# Patient Record
Sex: Female | Born: 1965
Health system: Southern US, Community
[De-identification: ages and names within clinical notes are randomized; demographics above are authoritative.]

## PROBLEM LIST (undated history)

## (undated) DIAGNOSIS — M311 Thrombotic microangiopathy: Secondary | ICD-10-CM

## (undated) DIAGNOSIS — M3119 Other thrombotic microangiopathy: Secondary | ICD-10-CM

## (undated) DIAGNOSIS — M549 Dorsalgia, unspecified: Secondary | ICD-10-CM

## (undated) DIAGNOSIS — M109 Gout, unspecified: Secondary | ICD-10-CM

## (undated) DIAGNOSIS — I10 Essential (primary) hypertension: Secondary | ICD-10-CM

## (undated) HISTORY — PX: ABDOMINAL HYSTERECTOMY: SHX81

## (undated) HISTORY — PX: HEMORROIDECTOMY: SUR656

---

## 2007-11-01 ENCOUNTER — Emergency Department: Payer: Self-pay | Admitting: Internal Medicine

## 2008-12-07 ENCOUNTER — Ambulatory Visit (HOSPITAL_COMMUNITY): Admission: RE | Admit: 2008-12-07 | Discharge: 2008-12-07 | Payer: Self-pay | Admitting: Gastroenterology

## 2008-12-22 ENCOUNTER — Ambulatory Visit (HOSPITAL_COMMUNITY): Admission: RE | Admit: 2008-12-22 | Discharge: 2008-12-22 | Payer: Self-pay | Admitting: Gastroenterology

## 2009-10-05 ENCOUNTER — Ambulatory Visit: Payer: Self-pay | Admitting: Endocrinology

## 2010-09-16 IMAGING — RF DG ESOPHAGUS
19 of 24 series · 19 of 24 positions shown · non-contrast
Comparison: None

CLINICAL DATA: Dysphagia with liquids, solids.  Mild epigastric
pain after eating.  History prior esophageal dilatation.

DOUBLE CONTRAST ESOPHAGRAM
TECHNIQUE: Standard double contrast esophagram was performed.
Fluoroscopy Time: 3.7 minutes

[Series 1: run · 1 of 9 slices shown (1 of 19)]
[im 1/9]
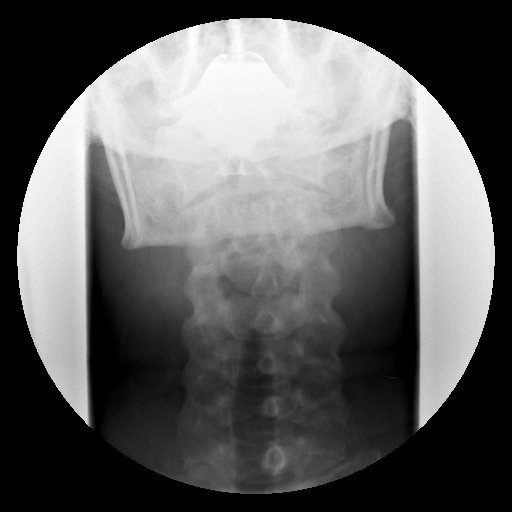

[Series 2: run · 1 of 6 slices shown (2 of 19)]
[im 1/6]
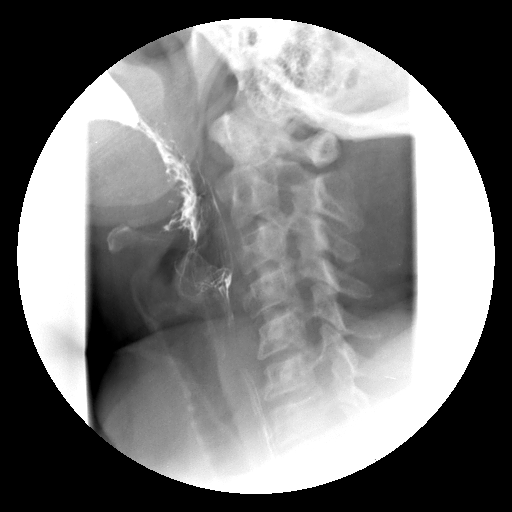

[Series 4: run · 1 of 3 slices shown (3 of 19)]
[im 1/3]
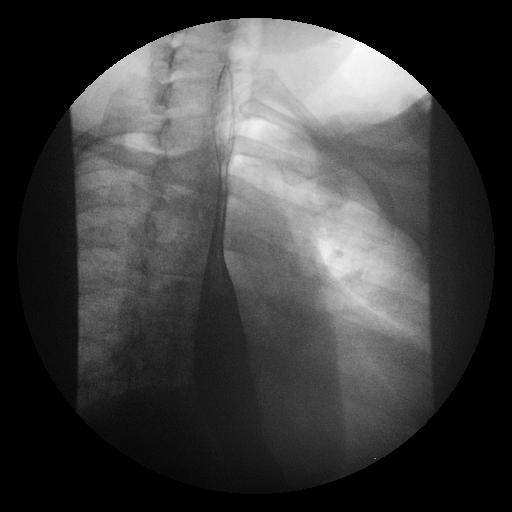

[Series 5: run · 1 of 3 slices shown (4 of 19)]
[im 1/3]
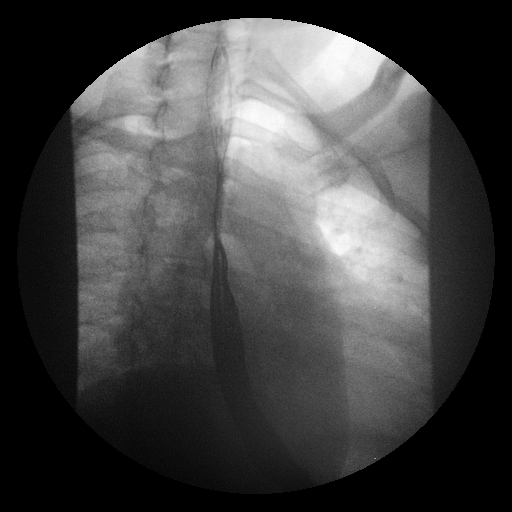

[Series 6: run · 1 of 4 slices shown (5 of 19)]
[im 1/4]
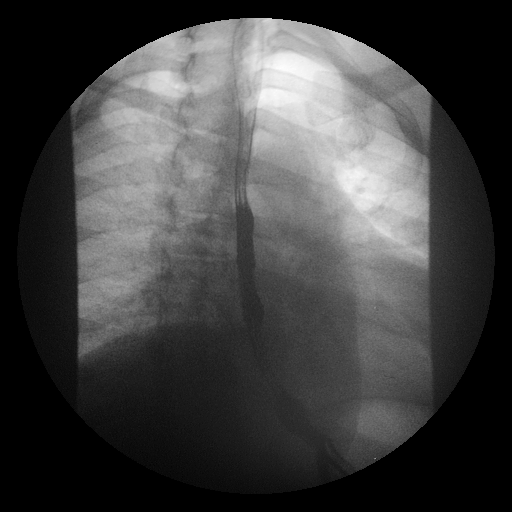

[Series 7: run · 1 of 3 slices shown (6 of 19)]
[im 1/3]
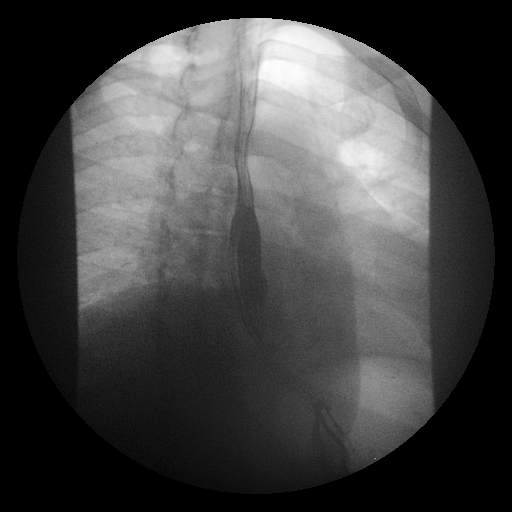

[Series 9: run · 1 of 4 slices shown (7 of 19)]
[im 1/4]
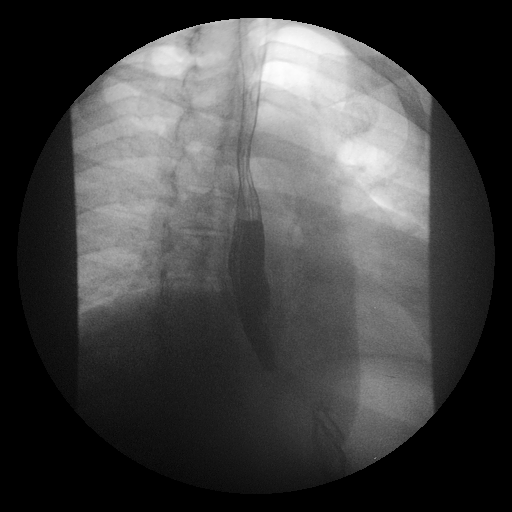

[Series 10: run · 1 of 1 slices shown (8 of 19)]
[im 1/1]
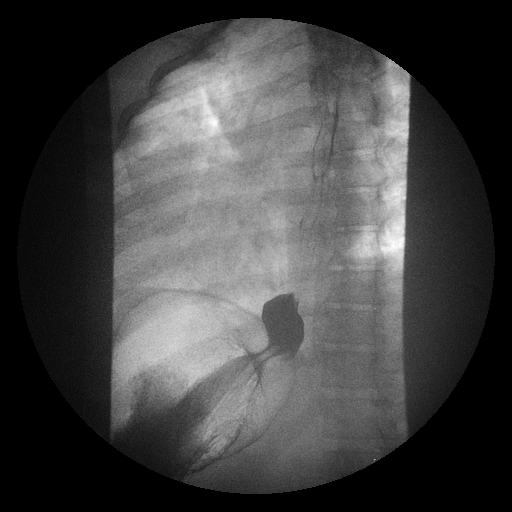

[Series 11: run · 1 of 1 slices shown (9 of 19)]
[im 1/1]
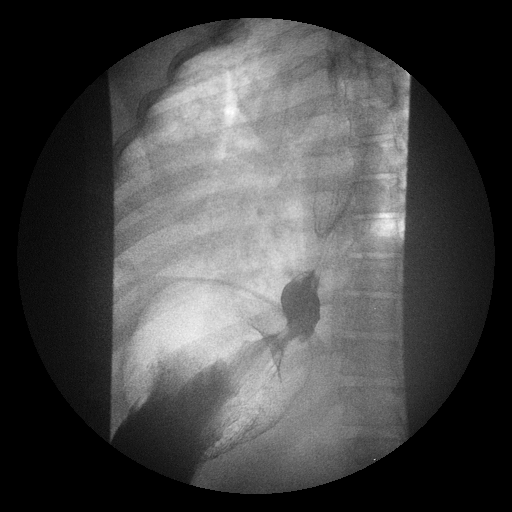

[Series 13: run · 1 of 3 slices shown (10 of 19)]
[im 1/3]
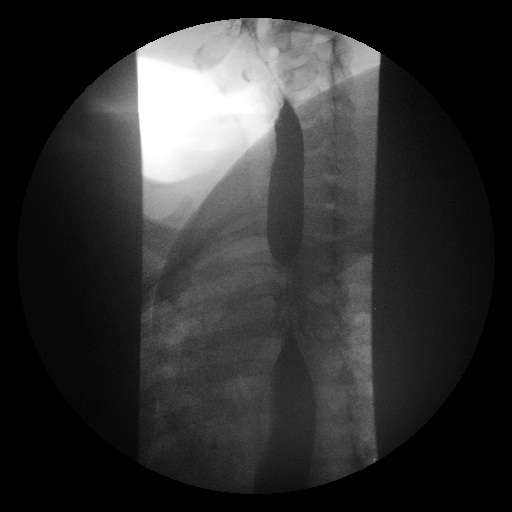

[Series 14: run · 1 of 1 slices shown (11 of 19)]
[im 1/1]
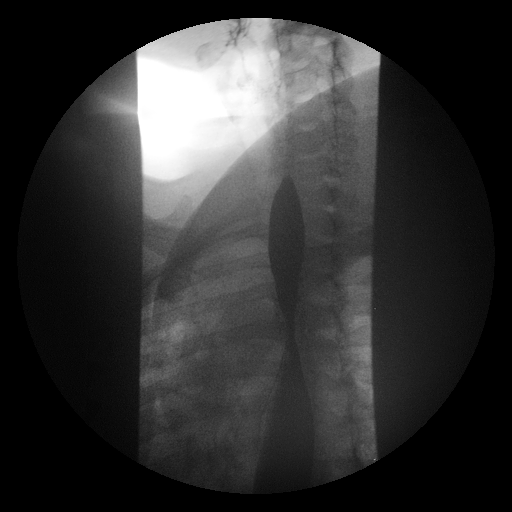

[Series 15: run · 1 of 3 slices shown (12 of 19)]
[im 1/3]
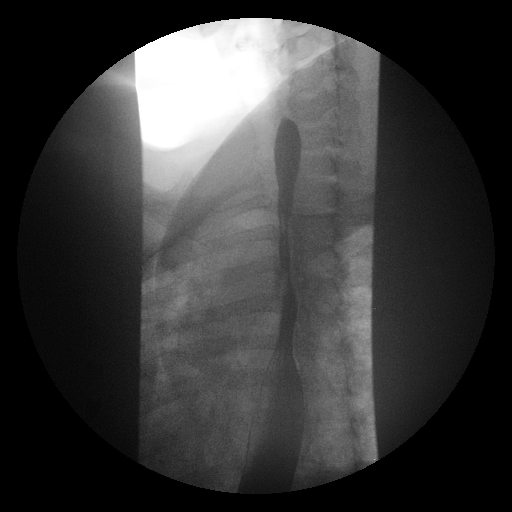

[Series 16: run · 1 of 1 slices shown (13 of 19)]
[im 1/1]
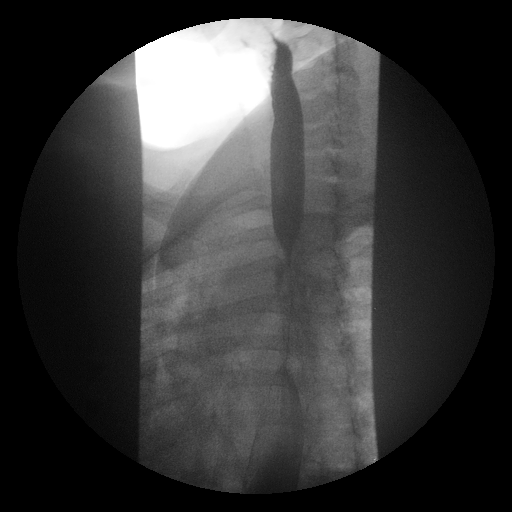

[Series 18: run · 1 of 1 slices shown (14 of 19)]
[im 1/1]
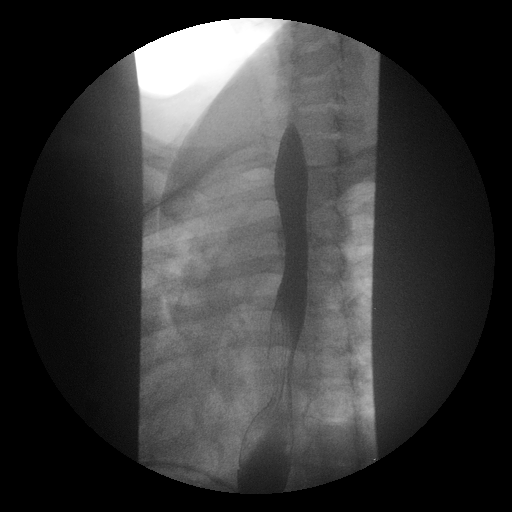

[Series 19: run · 1 of 1 slices shown (15 of 19)]
[im 1/1]
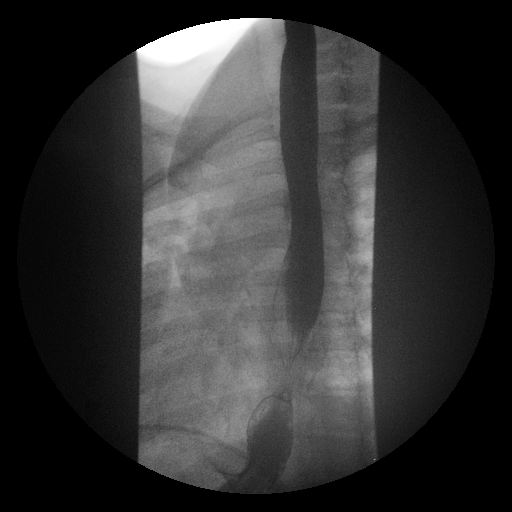

[Series 20: run · 1 of 1 slices shown (16 of 19)]
[im 1/1]
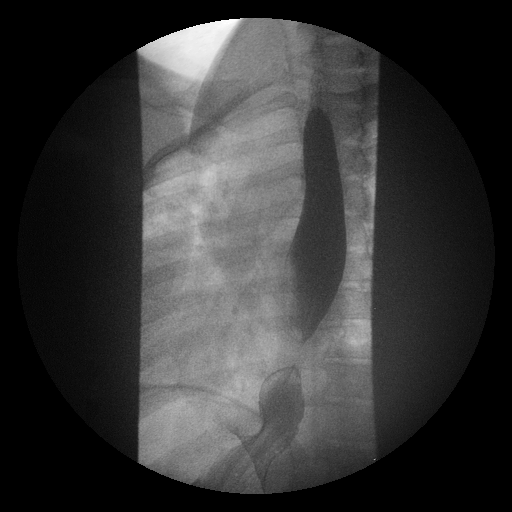

[Series 21: run · 1 of 1 slices shown (17 of 19)]
[im 1/1]
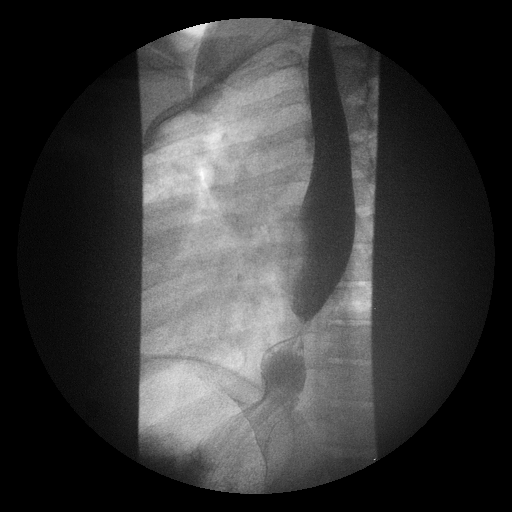

[Series 23: run · 1 of 1 slices shown (18 of 19)]
[im 1/1]
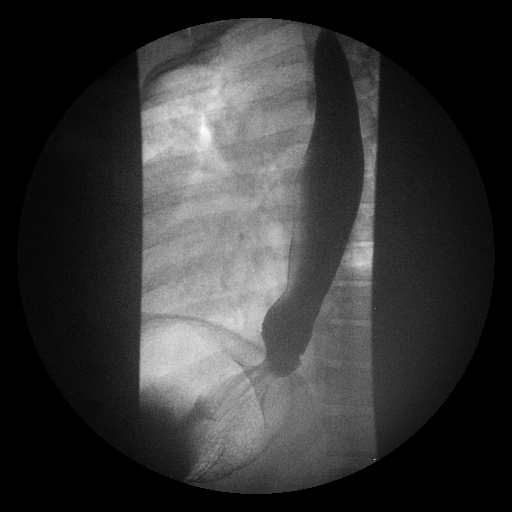

[Series 24: run · 1 of 1 slices shown (19 of 19)]
[im 1/1]
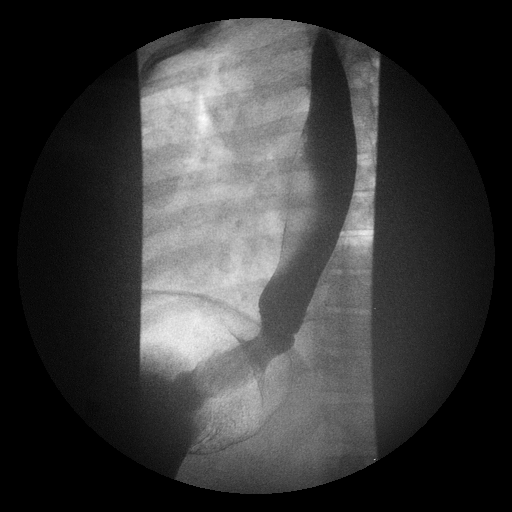

[19 of 24 positions shown; findings below may reference images not displayed]

FINDINGS: Hypopharyngeal portion of the exam demonstrates no
significant findings.

Double contrast evaluation of the esophagus demonstrates no mucosal
abnormality.

Evaluation of primary peristalsis demonstrates a normal primary
peristaltic wave.

Full column evaluation of the esophagus demonstrates small hiatal
hernia.  A wide-mouth, likely not clinically significant mucosal
ring is identified on series 27.

13 mm barium tablet passes without difficulty.
IMPRESSION: 1.  Small hiatal hernia.
2.  Wide-mouth, likely not clinically significant mucosal ring.

## 2016-08-28 ENCOUNTER — Encounter (HOSPITAL_BASED_OUTPATIENT_CLINIC_OR_DEPARTMENT_OTHER): Payer: Self-pay

## 2016-08-28 ENCOUNTER — Emergency Department (HOSPITAL_BASED_OUTPATIENT_CLINIC_OR_DEPARTMENT_OTHER)
Admission: EM | Admit: 2016-08-28 | Discharge: 2016-08-28 | Disposition: A | Discharge status: Home / Self Care | Payer: Managed Care, Other (non HMO) | Attending: Emergency Medicine | Admitting: Emergency Medicine

## 2016-08-28 DIAGNOSIS — I10 Essential (primary) hypertension: Secondary | ICD-10-CM | POA: Diagnosis not present

## 2016-08-28 DIAGNOSIS — M545 Low back pain, unspecified: Secondary | ICD-10-CM

## 2016-08-28 DIAGNOSIS — Z79899 Other long term (current) drug therapy: Secondary | ICD-10-CM | POA: Diagnosis not present

## 2016-08-28 DIAGNOSIS — F17210 Nicotine dependence, cigarettes, uncomplicated: Secondary | ICD-10-CM | POA: Diagnosis not present

## 2016-08-28 HISTORY — DX: Other thrombotic microangiopathy: M31.19

## 2016-08-28 HISTORY — DX: Thrombotic microangiopathy: M31.1

## 2016-08-28 HISTORY — DX: Gout, unspecified: M10.9

## 2016-08-28 HISTORY — DX: Dorsalgia, unspecified: M54.9

## 2016-08-28 HISTORY — DX: Essential (primary) hypertension: I10

## 2016-08-28 LAB — URINALYSIS, ROUTINE W REFLEX MICROSCOPIC
BILIRUBIN URINE: NEGATIVE
Glucose, UA: NEGATIVE mg/dL
Hgb urine dipstick: NEGATIVE
KETONES UR: NEGATIVE mg/dL
LEUKOCYTES UA: NEGATIVE
NITRITE: NEGATIVE
PH: 6.5 (ref 5.0–8.0)
PROTEIN: NEGATIVE mg/dL
Specific Gravity, Urine: 1.019 (ref 1.005–1.030)

## 2016-08-28 MED ORDER — HYDROMORPHONE HCL 1 MG/ML IJ SOLN
1.0000 mg | Freq: Once | INTRAMUSCULAR | Status: AC
  Administered 2016-08-28: 1 mg via INTRAMUSCULAR
  Filled 2016-08-28: qty 1

## 2016-08-28 MED ORDER — CYCLOBENZAPRINE HCL 10 MG PO TABS: 10.0000 mg | ORAL_TABLET | Freq: Two times a day (BID) | ORAL | 0 refills | Status: AC | PRN

## 2016-08-28 MED ORDER — DEXAMETHASONE SODIUM PHOSPHATE 10 MG/ML IJ SOLN
10.0000 mg | Freq: Once | INTRAMUSCULAR | Status: AC
  Administered 2016-08-28: 10 mg via INTRAMUSCULAR
  Filled 2016-08-28: qty 1

## 2016-08-28 MED ORDER — TRAMADOL HCL 50 MG PO TABS: 50.0000 mg | ORAL_TABLET | Freq: Four times a day (QID) | ORAL | 0 refills | Status: AC | PRN

## 2016-08-28 MED FILL — traMADol HCL 50 MG TABS: 50 | 4 days supply | Qty: 15 | Fill #0

## 2016-08-28 MED FILL — CYCLOBENZAPRINE 10 MG TABLE: 10 | 10 days supply | Qty: 20 | Fill #0

## 2016-08-28 NOTE — ED Provider Notes (Signed)
[facility]   CSN: [id number] Arrival date & time: 08/28/16  1210     History   Chief Complaint Chief Complaint  Patient presents with  . Back Pain    HPI Suzanne PickGloria Skinner is a 51 y.o. female.  Patient is a 51 year old female who presents with back pain. She states it started about 5 days ago. She was getting out of bed and noted that she started having some pain to her right lower back. There is no radiation down her legs. No associated abdominal pain. No nausea or vomiting. No fevers. It's worse with movements or bending over. She does have a little bit of what she describes as tingling on urination. She's been using over-the-counter medicines without improvement in symptoms. She does have a history of back pain in the past but says it's been a couple of years.      Past Medical History:  Diagnosis Date  . Back pain   . Gout   . Hypertension   . T.T.P. syndrome (HCC)     There are no active problems to display for this patient.   Past Surgical History:  Procedure Laterality Date  . ABDOMINAL HYSTERECTOMY    . HEMORROIDECTOMY      OB History    No data available       Home Medications    Prior to Admission medications   Medication Sig Start Date End Date Taking? Authorizing Provider  LISINOPRIL-HYDROCHLOROTHIAZIDE PO Take by mouth.   Yes [provider]  cyclobenzaprine (FLEXERIL) 10 MG tablet Take 1 tablet (10 mg total) by mouth 2 (two) times daily as needed for muscle spasms. 08/28/16   Rolan BuccoBelfi, Cannon Arreola, MD  traMADol (ULTRAM) 50 MG tablet Take 1 tablet (50 mg total) by mouth every 6 (six) hours as needed. 08/28/16   Rolan BuccoBelfi, Jsiah Menta, MD    Family History No family history on file.  Social History Social History  Substance Use Topics  . Smoking status: Current Some Day Smoker  . Smokeless tobacco: Never Used  . Alcohol use No     Allergies   Patient has no known allergies.   Review of Systems Review of Systems    Constitutional: Negative for chills, diaphoresis, fatigue and fever.  HENT: Negative for congestion, rhinorrhea and sneezing.   Eyes: Negative.   Respiratory: Negative for cough, chest tightness and shortness of breath.   Cardiovascular: Negative for chest pain and leg swelling.  Gastrointestinal: Negative for abdominal pain, blood in stool, diarrhea, nausea and vomiting.  Genitourinary: Negative for difficulty urinating, flank pain, frequency and hematuria.  Musculoskeletal: Positive for back pain. Negative for arthralgias.  Skin: Negative for rash.  Neurological: Negative for dizziness, speech difficulty, weakness, numbness and headaches.     Physical Exam Updated Vital Signs BP (!) 150/95 (BP Location: Right Arm)   Pulse 89   Temp 99 F (37.2 C) (Oral)   Resp 20   Ht 5\' 8"  (1.727 m)   Wt (!) 140.2 kg (309 lb)   SpO2 100%   BMI 46.98 kg/m   Physical Exam  Constitutional: She is oriented to person, place, and time. She appears well-developed and well-nourished.  Obese  HENT:  Head: Normocephalic and atraumatic.  Eyes: Pupils are equal, round, and reactive to light.  Neck: Normal range of motion. Neck supple.  Cardiovascular: Normal rate, regular rhythm and normal heart sounds.   Pulmonary/Chest: Effort normal and breath sounds normal. No respiratory distress. She has no wheezes. She has no  rales. She exhibits no tenderness.  Abdominal: Soft. Bowel sounds are normal. There is no tenderness. There is no rebound and no guarding.  Musculoskeletal: Normal range of motion. She exhibits no edema.  Patient has tenderness to the right lower lumbar paraspinal area.  Positive straight leg raise on the right. Patellar reflexes symmetric. She has normal motor function and sensation to the lower extremities. Pedal pulses are intact.  Lymphadenopathy:    She has no cervical adenopathy.  Neurological: She is alert and oriented to person, place, and time.  Skin: Skin is warm and dry. No  rash noted.  Psychiatric: She has a normal mood and affect.     ED Treatments / Results  Labs (all labs ordered are listed, but only abnormal results are displayed) Labs Reviewed  URINALYSIS, ROUTINE W REFLEX MICROSCOPIC    EKG  EKG Interpretation None       Radiology No results found.  Procedures Procedures (including critical care time)  Medications Ordered in ED Medications  HYDROmorphone (DILAUDID) injection 1 mg (1 mg Intramuscular Given 08/28/16 1443)  dexamethasone (DECADRON) injection 10 mg (10 mg Intramuscular Given 08/28/16 1442)     Initial Impression / Assessment and Plan / ED Course  I have reviewed the triage vital signs and the nursing notes.  Pertinent labs & imaging results that were available during my care of the patient were reviewed by me and considered in my medical decision making (see chart for details).     Patient presents with right-sided low back pain. She's neurologically intact. There is no signs of cauda equina. No associated abdominal pain. Her urinalysis is negative for infection. She's feeling better after treatment with an injection of Dilaudid and Decadron in the ED. She was discharged home in good condition. She was given prescriptions for tramadol and Flexeril. She was advised to start using ibuprofen in about 3 days given that she got a shot of Decadron in the ED. She has a follow-up appointment with her primary care physician next week. Return precautions were given.  Final Clinical Impressions(s) / ED Diagnoses   Final diagnoses:  Acute right-sided low back pain without sciatica    New Prescriptions New Prescriptions   CYCLOBENZAPRINE (FLEXERIL) 10 MG TABLET    Take 1 tablet (10 mg total) by mouth 2 (two) times daily as needed for muscle spasms.   TRAMADOL (ULTRAM) 50 MG TABLET    Take 1 tablet (50 mg total) by mouth every 6 (six) hours as needed.     Rolan Bucco, MD 08/28/16 [phone]

## 2016-08-28 NOTE — ED Triage Notes (Addendum)
C/o lower back pain x 5 days-started after getting out of bed-denies injury-reports hx of back pain-pain worse with movement-pt states she drove self here-presents to triage in w/c-NAD

## 2023-02-06 ENCOUNTER — Other Ambulatory Visit: Payer: Self-pay

## 2023-02-06 ENCOUNTER — Emergency Department (HOSPITAL_BASED_OUTPATIENT_CLINIC_OR_DEPARTMENT_OTHER)
Admission: EM | Admit: 2023-02-06 | Discharge: 2023-02-06 | Disposition: A | Discharge status: Home / Self Care | Payer: Managed Care, Other (non HMO) | Attending: Emergency Medicine | Admitting: Emergency Medicine

## 2023-02-06 ENCOUNTER — Encounter (HOSPITAL_BASED_OUTPATIENT_CLINIC_OR_DEPARTMENT_OTHER): Payer: Self-pay

## 2023-02-06 DIAGNOSIS — B379 Candidiasis, unspecified: Secondary | ICD-10-CM | POA: Diagnosis not present

## 2023-02-06 DIAGNOSIS — R300 Dysuria: Secondary | ICD-10-CM | POA: Diagnosis present

## 2023-02-06 DIAGNOSIS — I10 Essential (primary) hypertension: Secondary | ICD-10-CM | POA: Insufficient documentation

## 2023-02-06 DIAGNOSIS — Z79899 Other long term (current) drug therapy: Secondary | ICD-10-CM | POA: Diagnosis not present

## 2023-02-06 DIAGNOSIS — Z720 Tobacco use: Secondary | ICD-10-CM | POA: Diagnosis not present

## 2023-02-06 LAB — URINALYSIS, ROUTINE W REFLEX MICROSCOPIC
Bilirubin Urine: NEGATIVE
Glucose, UA: NEGATIVE mg/dL
Ketones, ur: NEGATIVE mg/dL
Leukocytes,Ua: NEGATIVE
Nitrite: NEGATIVE
Protein, ur: 100 mg/dL — AB
Specific Gravity, Urine: 1.025 (ref 1.005–1.030)
pH: 6.5 (ref 5.0–8.0)

## 2023-02-06 LAB — WET PREP, GENITAL
Clue Cells Wet Prep HPF POC: NONE SEEN
Sperm: NONE SEEN
Trich, Wet Prep: NONE SEEN
WBC, Wet Prep HPF POC: <10 (ref <10)

## 2023-02-06 LAB — URINALYSIS, MICROSCOPIC (REFLEX): WBC, UA: >50 WBC/hpf (ref 0–5)

## 2023-02-06 MED ORDER — FLUCONAZOLE 150 MG PO TABS
150.0000 mg | ORAL_TABLET | Freq: Once | ORAL | Status: AC
Start: 2023-02-06 — End: 2023-02-06
  Administered 2023-02-06: 150 mg via ORAL
  Filled 2023-02-06: qty 1

## 2023-02-06 MED ORDER — FLUCONAZOLE 150 MG PO TABS: 150.0000 mg | ORAL_TABLET | Freq: Every day | ORAL | 0 refills | Status: AC

## 2023-02-06 NOTE — ED Triage Notes (Signed)
 Pt states that she thinks she has a UTI. Burns a lot when she uses the restroom.

## 2023-02-06 NOTE — Discharge Instructions (Addendum)
 As discussed, you had a yeast infection.  We have given you fluconazole  in the ED today.  One-time medication.  If you have symptoms that persist in 72 hours, that means you need a second dose.  I have sent a prescription to your pharmacy for the second dose if you need it.  If you do not have symptoms, there is no need to take it.  You can follow-up with your primary care provider if symptoms persist.  Return to the ED if your symptoms worsen in the interim.

## 2023-02-06 NOTE — ED Provider Notes (Signed)
[place] [facility] AT [facility] Provider Note   CSN: [id number] Arrival date & time: 02/06/23  1123     History  Chief Complaint  Patient presents with   Dysuria    Suzanne Skinner is a 58 y.o. female with a history of hypertension who presents the ED today for dysuria.  She reports pain with urination for the past 2 days.  Denies any associated abdominal pain, back pain, fevers, nausea, vomiting, or hematuria.  She states that she was treated for UTI about a month ago and completed the antibiotics at that time.  She states that her symptoms went away until started having pain with urination again 2 days ago.  Additionally, she endorses vaginal discharge and odor.  She denies any recent sexual activity.  No additional complaints or concerns at this time.    Home Medications Prior to Admission medications   Medication Sig Start Date End Date Taking? Authorizing Provider  fluconazole (DIFLUCAN) 150 MG tablet Take 1 tablet (150 mg total) by mouth daily for 1 dose. 02/06/23 02/07/23 Yes Maxwell Marion, PA-C  cyclobenzaprine (FLEXERIL) 10 MG tablet Take 1 tablet (10 mg total) by mouth 2 (two) times daily as needed for muscle spasms. 08/28/16   Rolan Bucco, MD  LISINOPRIL-HYDROCHLOROTHIAZIDE PO Take by mouth.    [provider]  traMADol (ULTRAM) 50 MG tablet Take 1 tablet (50 mg total) by mouth every 6 (six) hours as needed. 08/28/16   Rolan Bucco, MD      Allergies    Patient has no known allergies.    Review of Systems   Review of Systems  Genitourinary:  Positive for dysuria.  All other systems reviewed and are negative.   Physical Exam Updated Vital Signs BP 114/81   Pulse (!) 59   Temp 98.2 F (36.8 C) (Oral)   Resp 18   Ht 5\' 5"  (1.651 m)   Wt 113.4 kg   SpO2 98%   BMI 41.60 kg/m  Physical Exam Vitals and nursing note reviewed.  Constitutional:      General: She is not in acute distress.    Appearance: Normal appearance.  HENT:      Head: Normocephalic and atraumatic.     Mouth/Throat:     Mouth: Mucous membranes are moist.  Eyes:     Conjunctiva/sclera: Conjunctivae normal.     Pupils: Pupils are equal, round, and reactive to light.  Cardiovascular:     Rate and Rhythm: Normal rate and regular rhythm.     Pulses: Normal pulses.     Heart sounds: Normal heart sounds.  Pulmonary:     Effort: Pulmonary effort is normal.     Breath sounds: Normal breath sounds.  Abdominal:     Palpations: Abdomen is soft.     Tenderness: There is no abdominal tenderness. There is no right CVA tenderness or left CVA tenderness.  Musculoskeletal:        General: Normal range of motion.     Cervical back: Normal range of motion.  Skin:    General: Skin is warm and dry.     Findings: No rash.  Neurological:     General: No focal deficit present.     Mental Status: She is alert.  Psychiatric:        Mood and Affect: Mood normal.        Behavior: Behavior normal.    ED Results / Procedures / Treatments   Labs (all labs ordered are listed, but  only abnormal results are displayed) Labs Reviewed  WET PREP, GENITAL - Abnormal; Notable for the following components:      Result Value   Yeast Wet Prep HPF POC PRESENT (*)    All other components within normal limits  URINALYSIS, ROUTINE W REFLEX MICROSCOPIC - Abnormal; Notable for the following components:   APPearance CLOUDY (*)    Hgb urine dipstick MODERATE (*)    Protein, ur 100 (*)    All other components within normal limits  URINALYSIS, MICROSCOPIC (REFLEX) - Abnormal; Notable for the following components:   Bacteria, UA RARE (*)    All other components within normal limits  URINE CULTURE    EKG None  Radiology No results found.  Procedures Procedures: not indicated.   Medications Ordered in ED Medications  fluconazole (DIFLUCAN) tablet 150 mg (150 mg Oral Given 02/06/23 1546)    ED Course/ Medical Decision Making/ A&P                                  Medical Decision Making Amount and/or Complexity of Data Reviewed Labs: ordered.   This patient presents to the ED for concern of dysuria, this involves an extensive number of treatment options, and is a complaint that carries with it a high risk of complications and morbidity.   Differential diagnosis includes: UTI, pyelonephritis, STI, BV, yeast infection, etc.   Comorbidities  See HPI above   Additional History  Additional history obtained from previous primary care note.  She was treated with nitrofurantoin for UTI on 12/24/2022.   Lab Tests  I ordered and personally interpreted labs.  The pertinent results include:   UA shows moderate hemoglobin but rare bacteria.  No nitrates or leukocytes.  Culture pending. Wet prep positive for yeast infection   Problem List / ED Course / Critical Interventions / Medication Management  Dysuria, vaginal discharge, and odor for the past 2 days.  She states that she is divorced and has not had sexual relations.  Low suspicion for STI.  Due to recent antibiotic use in the past month, it is likely that patient has a yeast infection. I ordered medications including: Diflucan for yeast infection - medication given prior to discharge. I have reviewed the patients home medicines and have made adjustments as needed.    Social Determinants of Health  Tobacco use   Test / Admission - Considered  Discussed findings with patient.  All questions were answered.  I sent a prescription provide if looking into the pharmacy.  Patient instructed to only to pick it up symptoms persist after 72 hours, otherwise she does not need it.  She is agreeable with the plan. She is hemodynamically stable and safe for discharge home. Return precautions provided.       Final Clinical Impression(s) / ED Diagnoses Final diagnoses:  Yeast infection    Rx / DC Orders ED Discharge Orders          Ordered    fluconazole (DIFLUCAN) 150 MG tablet  Daily         02/06/23 1543              Maxwell Marion, PA-C 02/06/23 1551    Lonell Grandchild, MD 02/07/23 [phone]

## 2023-02-08 LAB — URINE CULTURE: Culture: 40000 — AB

## 2023-02-09 ENCOUNTER — Telehealth (HOSPITAL_BASED_OUTPATIENT_CLINIC_OR_DEPARTMENT_OTHER): Payer: Self-pay | Admitting: *Deleted

## 2023-02-09 NOTE — Progress Notes (Signed)
ED Antimicrobial Stewardship Positive Culture Follow Up   Suzanne Skinner is an 58 y.o. female who presented to [facility] on @ADMITDT @ with a chief complaint of  Chief Complaint  Patient presents with   Dysuria    Recent Results (from the past 720 hours)  Urine Culture     Status: Abnormal   Collection Time: 02/06/23 11:35 AM   Specimen: Urine, Clean Catch  Result Value Ref Range Status   Specimen Description   Final    URINE, CLEAN CATCH Performed at [facility], [address] [place], [place] [address]    Special Requests   Final    NONE Performed at [facility], [address] [place], [place] [address]    Culture 40,000 COLONIES/mL ESCHERICHIA COLI (A)  Final   Report Status 02/08/2023 FINAL  Final   Organism ID, Bacteria ESCHERICHIA COLI (A)  Final      Susceptibility   Escherichia coli - MIC*    AMPICILLIN 8 SENSITIVE Sensitive     CEFAZOLIN <=4 SENSITIVE Sensitive     CEFEPIME <=0.12 SENSITIVE Sensitive     CEFTRIAXONE <=0.25 SENSITIVE Sensitive     CIPROFLOXACIN <=0.25 SENSITIVE Sensitive     GENTAMICIN <=1 SENSITIVE Sensitive     IMIPENEM <=0.25 SENSITIVE Sensitive     NITROFURANTOIN <=16 SENSITIVE Sensitive     TRIMETH/SULFA <=20 SENSITIVE Sensitive     AMPICILLIN/SULBACTAM 4 SENSITIVE Sensitive     PIP/TAZO <=4 SENSITIVE Sensitive ug/mL    * 40,000 COLONIES/mL ESCHERICHIA COLI  Wet prep, genital     Status: Abnormal   Collection Time: 02/06/23  2:58 PM   Specimen: Vaginal  Result Value Ref Range Status   Yeast Wet Prep HPF POC PRESENT (A) NONE SEEN Final   Trich, Wet Prep NONE SEEN NONE SEEN Final   Clue Cells Wet Prep HPF POC NONE SEEN NONE SEEN Final   WBC, Wet Prep HPF POC <10 <10 Final   Sperm NONE SEEN  Final    Comment: Performed at [facility], [address] [place], [place] [address]    [x]  Prescribed fluconazole for yeast infection. Urine culture findings would suggest possible need for antibiotic treatment  as well.   Take fluconazole as prescribed if have not done so already New antibiotic prescription: Cephalexin 500mg  BID for 5 days (Qty Sufficient; Refills 0)  ED Provider: Benard Halsted, PharmD, BCPS 02/09/2023 12:58 PM ED Clinical Pharmacist -  [phone]

## 2023-02-09 NOTE — Telephone Encounter (Signed)
Post ED Visit - Positive Culture Follow-up: Unsuccessful Patient Follow-up  Culture assessed and recommendations reviewed by:  [x]  Delmar Landau, Pharm.D. []  Celedonio Miyamoto, Pharm.D., BCPS AQ-ID []  Garvin Fila, Pharm.D., BCPS []  Georgina Pillion, Pharm.D., BCPS []  [place], [address].D., BCPS, AAHIVP []  Estella Husk, Pharm.D., BCPS, AAHIVP []  Sherlynn Carbon, PharmD []  Pollyann Samples, PharmD, BCPS  Positive urine culture  [x]  Patient discharged without antimicrobial prescription and treatment is now indicated []  Organism is resistant to prescribed ED discharge antimicrobial []  Patient with positive blood cultures   Unable to contact patient , letter will be sent to address on file  Plan: Take Fluconazole as prescribed if havent done so already. Start Keflex 500mg  BID x 5 days per Dr. Gretchen Portela, Nydia Bouton 02/09/2023, 4:27 PM
